# Patient Record
Sex: Female | Born: 1950 | Hispanic: No | Marital: Married | Smoking: Never smoker
Health system: Southern US, Community
[De-identification: ages and names within clinical notes are randomized; demographics above are authoritative.]

---

## 2020-05-07 ENCOUNTER — Encounter (HOSPITAL_BASED_OUTPATIENT_CLINIC_OR_DEPARTMENT_OTHER): Payer: Self-pay | Admitting: Emergency Medicine

## 2020-05-07 ENCOUNTER — Emergency Department (HOSPITAL_BASED_OUTPATIENT_CLINIC_OR_DEPARTMENT_OTHER): Payer: Self-pay

## 2020-05-07 ENCOUNTER — Other Ambulatory Visit: Payer: Self-pay

## 2020-05-07 ENCOUNTER — Emergency Department (HOSPITAL_BASED_OUTPATIENT_CLINIC_OR_DEPARTMENT_OTHER)
Admission: EM | Admit: 2020-05-07 | Discharge: 2020-05-07 | Disposition: A | Discharge status: Home / Self Care | Payer: Self-pay | Attending: Emergency Medicine | Admitting: Emergency Medicine

## 2020-05-07 DIAGNOSIS — R0602 Shortness of breath: Secondary | ICD-10-CM | POA: Insufficient documentation

## 2020-05-07 DIAGNOSIS — R202 Paresthesia of skin: Secondary | ICD-10-CM | POA: Insufficient documentation

## 2020-05-07 DIAGNOSIS — E669 Obesity, unspecified: Secondary | ICD-10-CM | POA: Insufficient documentation

## 2020-05-07 DIAGNOSIS — R200 Anesthesia of skin: Secondary | ICD-10-CM | POA: Insufficient documentation

## 2020-05-07 LAB — BASIC METABOLIC PANEL
Anion gap: 11 (ref 5–15)
BUN: 22 mg/dL (ref 8–23)
CO2: 23 mmol/L (ref 22–32)
Calcium: 9.3 mg/dL (ref 8.9–10.3)
Chloride: 106 mmol/L (ref 98–111)
Creatinine, Ser: 0.87 mg/dL (ref 0.44–1.00)
GFR calc Af Amer: >60 mL/min (ref >60)
GFR calc non Af Amer: >60 mL/min (ref >60)
Glucose, Bld: 82 mg/dL (ref 70–99)
Potassium: 4.3 mmol/L (ref 3.5–5.1)
Sodium: 140 mmol/L (ref 135–145)

## 2020-05-07 LAB — D-DIMER, QUANTITATIVE: D-Dimer, Quant: 1.09 ug/mL-FEU — ABNORMAL HIGH (ref 0.00–0.50)

## 2020-05-07 LAB — CBC WITH DIFFERENTIAL/PLATELET
Abs Immature Granulocytes: 0.04 10*3/uL (ref 0.00–0.07)
Basophils Absolute: 0.1 10*3/uL (ref 0.0–0.1)
Basophils Relative: 1 %
Eosinophils Absolute: 0.2 10*3/uL (ref 0.0–0.5)
Eosinophils Relative: 2 %
HCT: 36.8 % (ref 36.0–46.0)
Hemoglobin: 11.3 g/dL — ABNORMAL LOW (ref 12.0–15.0)
Immature Granulocytes: 0 %
Lymphocytes Relative: 28 %
Lymphs Abs: 3.6 10*3/uL (ref 0.7–4.0)
MCH: 28.1 pg (ref 26.0–34.0)
MCHC: 30.7 g/dL (ref 30.0–36.0)
MCV: 91.5 fL (ref 80.0–100.0)
Monocytes Absolute: 0.6 10*3/uL (ref 0.1–1.0)
Monocytes Relative: 4 %
Neutro Abs: 8.1 10*3/uL — ABNORMAL HIGH (ref 1.7–7.7)
Neutrophils Relative %: 65 %
Platelets: 354 10*3/uL (ref 150–400)
RBC: 4.02 MIL/uL (ref 3.87–5.11)
RDW: 14.2 % (ref 11.5–15.5)
WBC: 12.5 10*3/uL — ABNORMAL HIGH (ref 4.0–10.5)
nRBC: 0 % (ref 0.0–0.2)

## 2020-05-07 LAB — BRAIN NATRIURETIC PEPTIDE: B Natriuretic Peptide: 55.4 pg/mL (ref 0.0–100.0)

## 2020-05-07 LAB — TROPONIN I (HIGH SENSITIVITY)
Troponin I (High Sensitivity): 4 ng/L (ref <18)
Troponin I (High Sensitivity): 4 ng/L (ref <18)

## 2020-05-07 MED ORDER — IOHEXOL 350 MG/ML SOLN
100.0000 mL | Freq: Once | INTRAVENOUS | Status: AC | PRN
  Administered 2020-05-07: 100 mL via INTRAVENOUS

## 2020-05-07 NOTE — Discharge Instructions (Signed)
Please schedule a follow-up appointment with a primary care doctor as well as with cardiology regarding your shortness of breath.  Please return to ER if your breathing worsens, you develop worsening numbness, weakness, vision changes or speech changes.

## 2020-05-07 NOTE — ED Triage Notes (Signed)
Left leg numbness and SOB with exertion for months.  Is visiting family from [place]. Speaks Urdu.  Daughter in law in attendance to translate.

## 2020-05-07 NOTE — ED Notes (Signed)
Patient transported to X-ray 

## 2020-05-08 NOTE — ED Provider Notes (Signed)
[facility] EMERGENCY DEPARTMENT Provider Note   CSN: [id number] Arrival date & time: 05/07/20  1527     History Chief Complaint  Patient presents with  . L Leg Numb & SOB x Months    Ashley Andrade is a 69 y.o. female.  Presents to ER with concern for multiple symptoms.  Patient is from [place] is currently visiting family in [place].  Accompanied by daughter who is fluent in [place].  Ever since patient's husband passed a few years ago, she has had increased shortness of breath.  The shortness of breath is worse with exertion and seems to have worsened over the past few years.  The daughter is concerned that the shortness of breath has worsened since she has been visiting down in [place] over the past few weeks.  There has been no significant change in the breathing today.  There is no associated chest pain, no cough, fever.  The second concern is for patient's numb/tingling sensation in her legs.  Initially the daughter reported that patient had numbness in her left leg for many months.  When utilizing the language line translator however, the patient clarified that she has had tingling sensation in both of her legs, isolated to her feet, ankles and does not spread to the rest of her extremities.  There is no associated muscle weakness, she has had no speech changes, no vision changes or other neurologic concerns.  There is no change in the symptoms today.  Daughter reports that another family member who is a Advice worker had evaluated the patient and recommended that she come to the ER to get checked out.  Patient has never seen a medical provider for these concerns in this country.  Both patient and daughter are unaware of prior medical diagnoses from [place].  Does not take any prescriptions on a regular basis.  History obtained via daughter as well as directly via patient via audio language line interpreter.  HPI     History reviewed. No pertinent  past medical history.  There are no problems to display for this patient.   History reviewed. No pertinent surgical history.   OB History   No obstetric history on file.     No family history on file.  Social History   Tobacco Use  . Smoking status: Never Smoker  . Smokeless tobacco: Never Used  Substance Use Topics  . Alcohol use: Never  . Drug use: Not on file    Home Medications Prior to Admission medications   Not on File    Allergies    Patient has no known allergies.  Review of Systems   Review of Systems  Constitutional: Negative for chills and fever.  HENT: Negative for ear pain and sore throat.   Eyes: Negative for pain and visual disturbance.  Respiratory: Positive for shortness of breath. Negative for cough.   Cardiovascular: Negative for chest pain and palpitations.  Gastrointestinal: Negative for abdominal pain and vomiting.  Genitourinary: Negative for dysuria and hematuria.  Musculoskeletal: Negative for arthralgias and back pain.  Skin: Negative for color change and rash.  Neurological: Positive for numbness. Negative for seizures and syncope.  All other systems reviewed and are negative.   Physical Exam Updated Vital Signs BP (!) 151/66   Pulse 63   Temp 98 F (36.7 C)   Resp 16   Ht 5' (1.524 m)   Wt 90.7 kg   SpO2 98%   BMI 39.06 kg/m   Physical Exam  Vitals and nursing note reviewed.  Constitutional:      General: She is not in acute distress.    Appearance: She is well-developed. She is obese.  HENT:     Head: Normocephalic and atraumatic.  Eyes:     Conjunctiva/sclera: Conjunctivae normal.  Cardiovascular:     Rate and Rhythm: Normal rate and regular rhythm.     Heart sounds: No murmur heard.   Pulmonary:     Effort: Pulmonary effort is normal. No respiratory distress.     Breath sounds: Normal breath sounds.  Abdominal:     Palpations: Abdomen is soft.     Tenderness: There is no abdominal tenderness.    Musculoskeletal:     Cervical back: Neck supple.  Skin:    General: Skin is warm and dry.     Capillary Refill: Capillary refill takes less than 2 seconds.  Neurological:     Mental Status: She is alert.     Comments: AAOx3 CN 2-12 intact, speech clear visual fields intact 5/5 strength in b/l UE and LE Sensation to light touch intact in b/l UE and LE Normal FNF Normal gait     ED Results / Procedures / Treatments   Labs (all labs ordered are listed, but only abnormal results are displayed) Labs Reviewed  CBC WITH DIFFERENTIAL/PLATELET - Abnormal; Notable for the following components:      Result Value   WBC 12.5 (*)    Hemoglobin 11.3 (*)    Neutro Abs 8.1 (*)    All other components within normal limits  D-DIMER, QUANTITATIVE (NOT AT [facility]) - Abnormal; Notable for the following components:   D-Dimer, Quant 1.09 (*)    All other components within normal limits  BASIC METABOLIC PANEL  BRAIN NATRIURETIC PEPTIDE  TROPONIN I (HIGH SENSITIVITY)  TROPONIN I (HIGH SENSITIVITY)    EKG None  Radiology DG Chest 2 View  Result Date: 05/07/2020 CLINICAL DATA:  Left leg numbness and shortness of breath with exertion for months. EXAM: CHEST - 2 VIEW COMPARISON:  None. FINDINGS: Mildly enlarged cardiac silhouette with mild prominence of the pulmonary vasculature and interstitial markings. No pleural fluid. Unremarkable bones. IMPRESSION: 1. Mild cardiomegaly and mild pulmonary vascular congestion. 2. Mild chronic interstitial lung disease. Electronically Signed   By: Beckie Salts M.D.   On: 05/07/2020 20:24   CT Angio Chest PE W and/or Wo Contrast  Result Date: 05/07/2020 CLINICAL DATA:  69 year old female with shortness of breath and elevated D-dimer. Concern for pulmonary embolism. EXAM: CT ANGIOGRAPHY CHEST WITH CONTRAST TECHNIQUE: Multidetector CT imaging of the chest was performed using the standard protocol during bolus administration of intravenous contrast. Multiplanar CT image  reconstructions and MIPs were obtained to evaluate the vascular anatomy. CONTRAST:  OMNIPAQUE IOHEXOL 350 MG/ML SOLN COMPARISON:  Chest radiograph dated 05/07/2020. FINDINGS: Cardiovascular: Borderline cardiomegaly. No pericardial effusion. There is mild atherosclerotic calcification of the thoracic aorta. No aneurysmal dilatation or dissection. The origins of the great vessels of the aortic arch appear patent as visualized. No pulmonary artery embolus identified. Mediastinum/Nodes: There is no hilar or mediastinal adenopathy. The esophagus and the thyroid gland are grossly unremarkable. No mediastinal fluid collection. Lungs/Pleura: No focal consolidation, pleural effusion, or pneumothorax. The central airways are patent. Upper Abdomen: There is a 1 cm indeterminate left adrenal nodule. Probable mild fatty liver. Musculoskeletal: No chest wall abnormality. No acute or significant osseous findings. Review of the MIP images confirms the above findings. IMPRESSION: No acute intrathoracic pathology. No CT evidence  of pulmonary embolism. Electronically Signed   By: Elgie Collard M.D.   On: 05/07/2020 21:30    Procedures Procedures (including critical care time)  Medications Ordered in ED Medications  iohexol (OMNIPAQUE) 350 MG/ML injection 100 mL (100 mLs Intravenous Contrast Given 05/07/20 2114)    ED Course  I have reviewed the triage vital signs and the nursing notes.  Pertinent labs & imaging results that were available during my care of the patient were reviewed by me and considered in my medical decision making (see chart for details).    MDM Rules/Calculators/A&P                          69 year old lady presented to the emergency room with concern for shortness of breath, leg numbness.  Obtain detailed history both from patient's daughter as well as the patient directly through an interpreter.  Family is concerned about patient's chronic shortness of breath as numbness/tingling  sensation in her legs.  Regarding the shortness of breath, patient denies any acute worsening of this symptom and states that she is struggled with this chronically for years.  She has clear lungs, normal vital signs and no hypoxia.  Commenced broad work-up for this symptom which included checking EKG which had no acute changes, BNP which was within normal limits, troponin within normal limits, D-dimer was elevated so CTA chest was obtained which was negative for acute pulmonary embolism or other acute cardiopulmonary process.    Regarding the reported leg numbness.  Patient clarified that this was isolated to her feet, very lower legs.  She did have intact sensation to light touch when I examined her and had no other neurologic complaints, and I was unable to ascertain any neurologic deficits.  Given the chronicity of symptoms, lack of focal exam findings, I highly doubt any acute neurologic process is occurring today and at present do not feel patient warrants emergent head imaging or other emergent neurologic work-up.  Given today's reassuring exam and work-up, believe patient can be discharged from the ER and managed in the outpatient setting.  Obviously given patient's age, morbid obesity, she is at risk for multiple chronic medical conditions and would greatly benefit from getting established with a primary care doctor.  Given her chronic shortness of breath, believe she would also probably benefit from being evaluated by a cardiologist in the outpatient setting.  Provided information for the community care clinic as well as for cardiology.    After the discussed management above, the patient was determined to be safe for discharge.  The patient and daughter was in agreement with this plan and all questions regarding their care were answered.  ED return precautions were discussed and the patient will return to the ED with any significant worsening of condition.   Final Clinical Impression(s) / ED  Diagnoses Final diagnoses:  Shortness of breath  Numbness and tingling of leg    Rx / DC Orders ED Discharge Orders         Ordered    Ambulatory referral to Cardiology     Discontinue  Reprint     05/07/20 2222           Milagros Loll, MD 05/08/20 1314

## 2021-12-26 IMAGING — CT CT ANGIO CHEST
2 of 8 series · 19 of 36 positions shown · IV contrast (Omnipaque)
Comparison: Chest radiograph dated 05/07/2020.

CLINICAL DATA: 69-year-old female with shortness of breath and
elevated D-dimer. Concern for pulmonary embolism.

EXAM:
CT ANGIOGRAPHY CHEST WITH CONTRAST
TECHNIQUE: Multidetector CT imaging of the chest was performed using the
standard protocol during bolus administration of intravenous
contrast. Multiplanar CT image reconstructions and MIPs were
obtained to evaluate the vascular anatomy.
CONTRAST:  100mL OMNIPAQUE IOHEXOL 350 MG/ML SOLN

[Series 6: pe coronal mpr · coronal · 0.59mm/px · 1 of 94 slices shown]
[im 47/94  mediastinal]
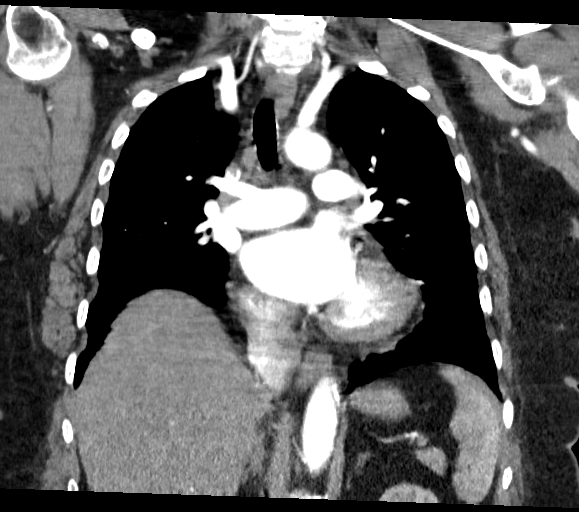

[Series 10: pe thins · axial · 0.68mm/px · z∈[-147,+103]mm · 18 of 371 slices shown]
[im 19/371  lung]
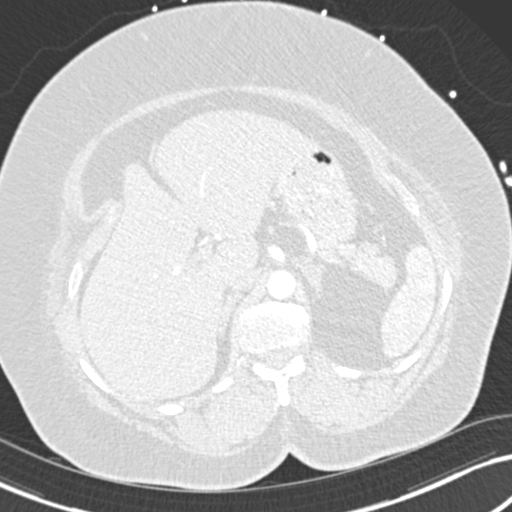
[im 38/371  mediastinal]
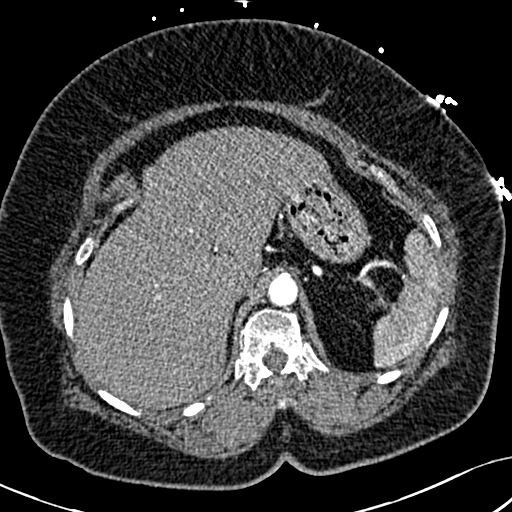
[im 56/371  lung]
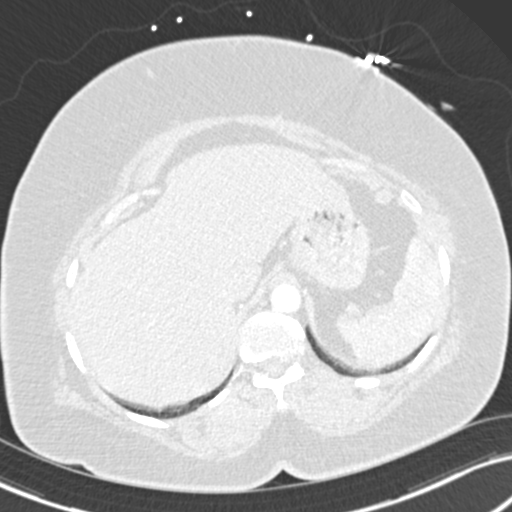
[im 75/371  mediastinal]
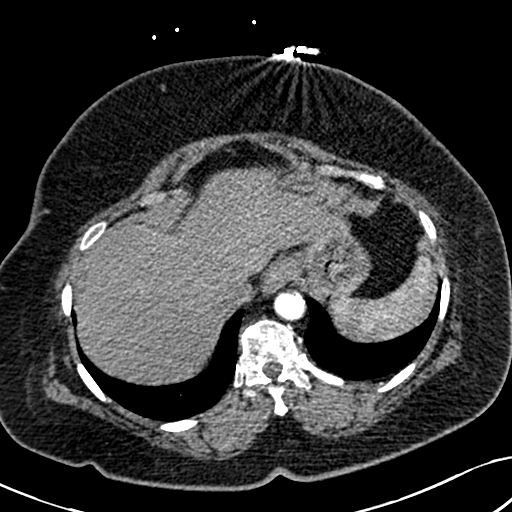
[im 93/371  lung]
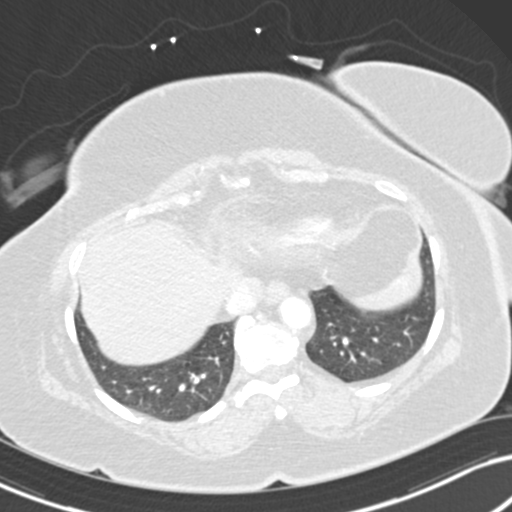
[im 112/371  mediastinal]
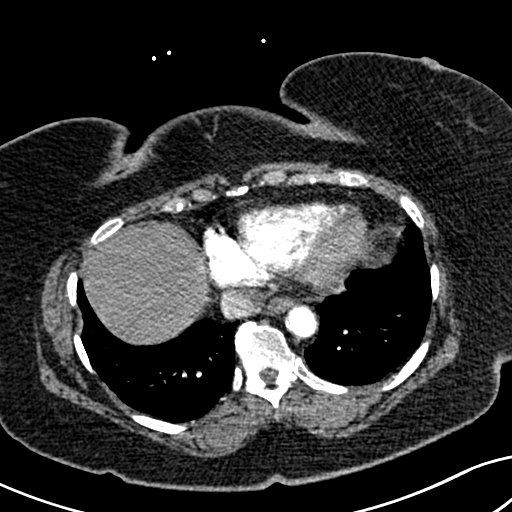
[im 130/371  lung]
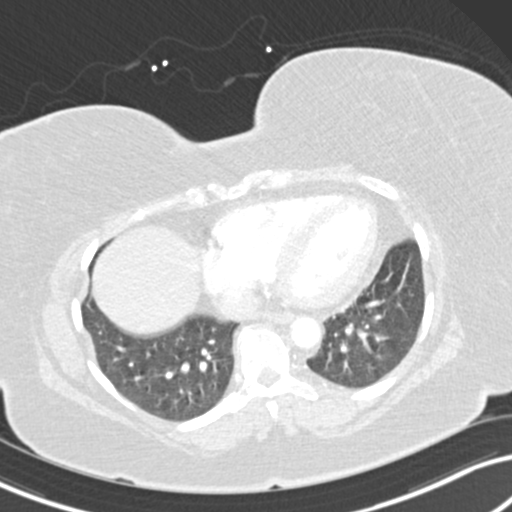
[im 149/371  mediastinal]
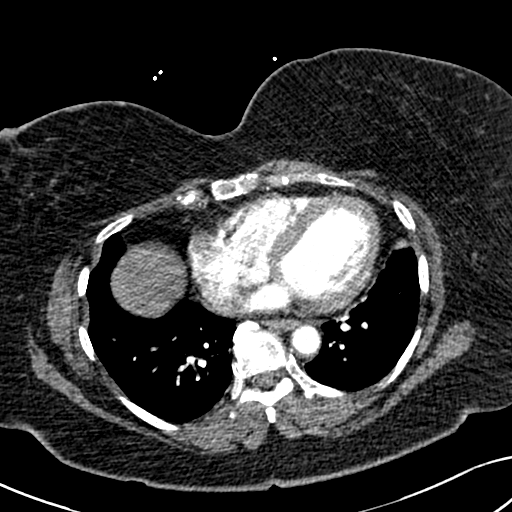
[im 167/371  lung]
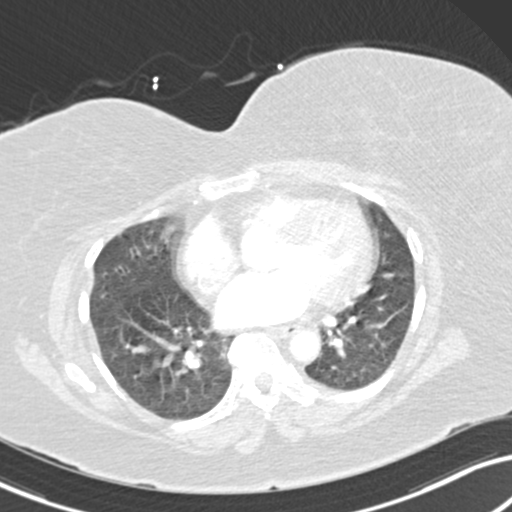
[im 204/371  mediastinal]
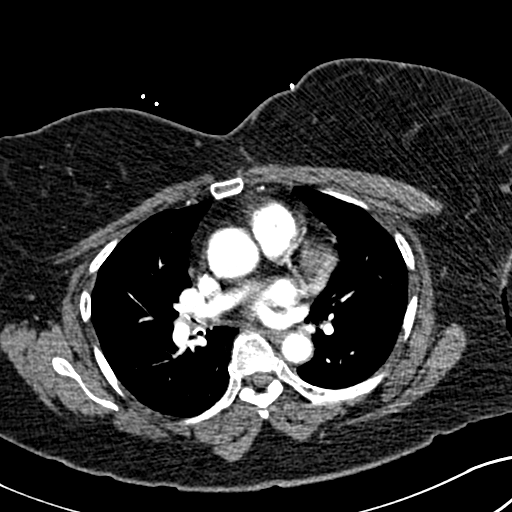
[im 223/371  lung]
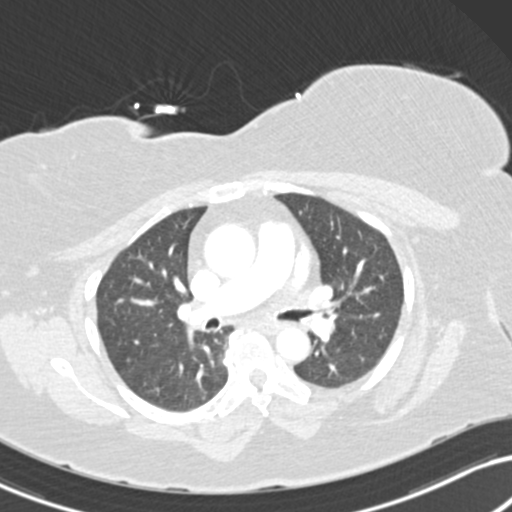
[im 241/371  mediastinal]
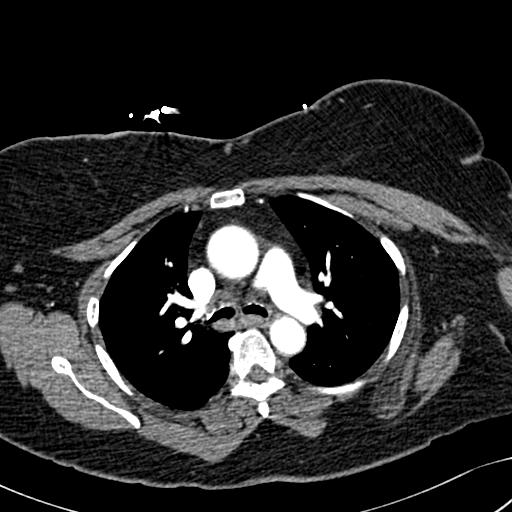
[im 260/371  lung]
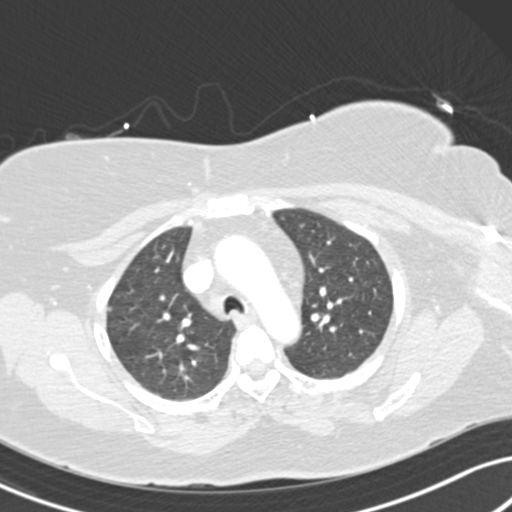
[im 278/371  mediastinal]
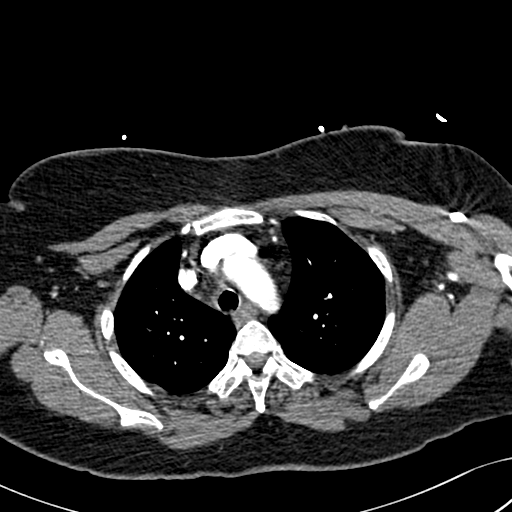
[im 297/371  lung]
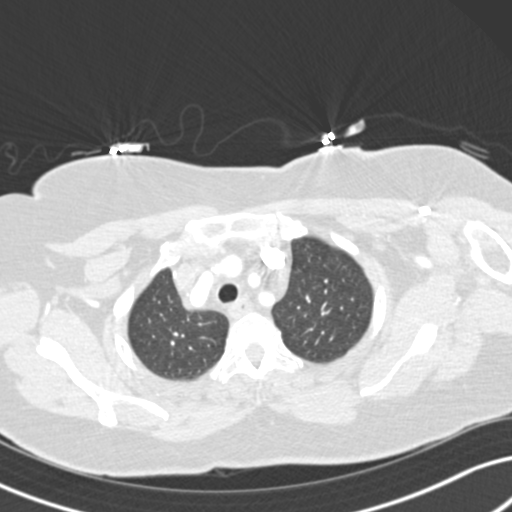
[im 315/371  mediastinal]
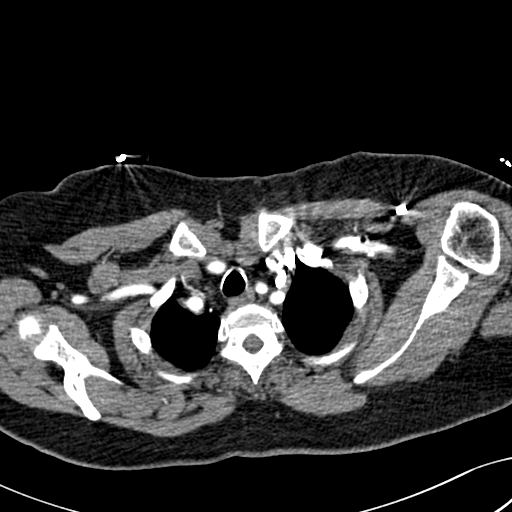
[im 334/371  lung]
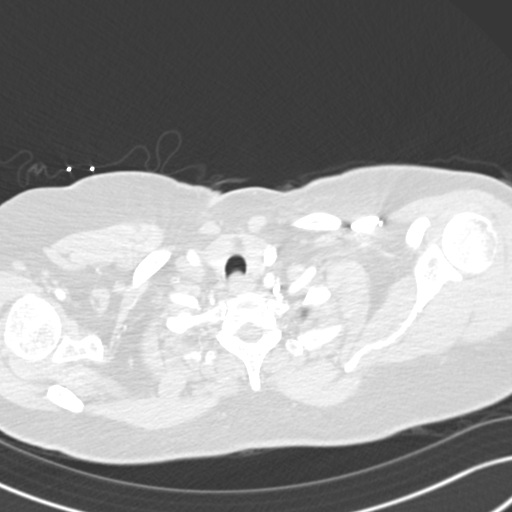
[im 352/371  mediastinal]
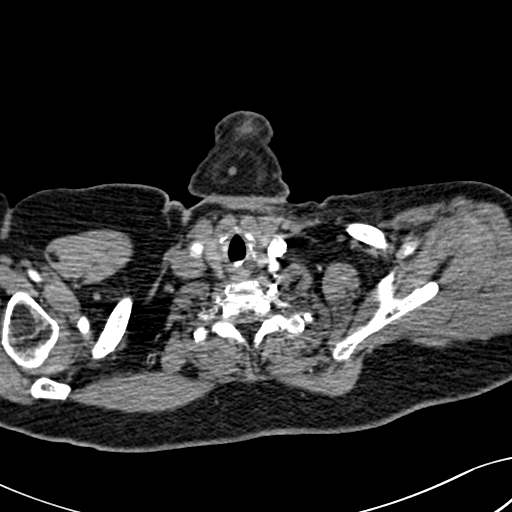

[19 of 36 positions shown; findings below may reference images not displayed]

FINDINGS: Cardiovascular: Borderline cardiomegaly. No pericardial effusion.
There is mild atherosclerotic calcification of the thoracic aorta.
No aneurysmal dilatation or dissection. The origins of the great
vessels of the aortic arch appear patent as visualized. No pulmonary
artery embolus identified.

Mediastinum/Nodes: There is no hilar or mediastinal adenopathy. The
esophagus and the thyroid gland are grossly unremarkable. No
mediastinal fluid collection.

Lungs/Pleura: No focal consolidation, pleural effusion, or
pneumothorax. The central airways are patent.

Upper Abdomen: There is a 1 cm indeterminate left adrenal nodule.
Probable mild fatty liver.

Musculoskeletal: No chest wall abnormality. No acute or significant
osseous findings.

Review of the MIP images confirms the above findings.
IMPRESSION: No acute intrathoracic pathology. No CT evidence of pulmonary
embolism.

## 2021-12-26 IMAGING — DX DG CHEST 2V
2 series · 2 of 2 positions shown · non-contrast
Comparison: None.

CLINICAL DATA: Left leg numbness and shortness of breath with
exertion for months.

EXAM:
CHEST - 2 VIEW

[chest pa]
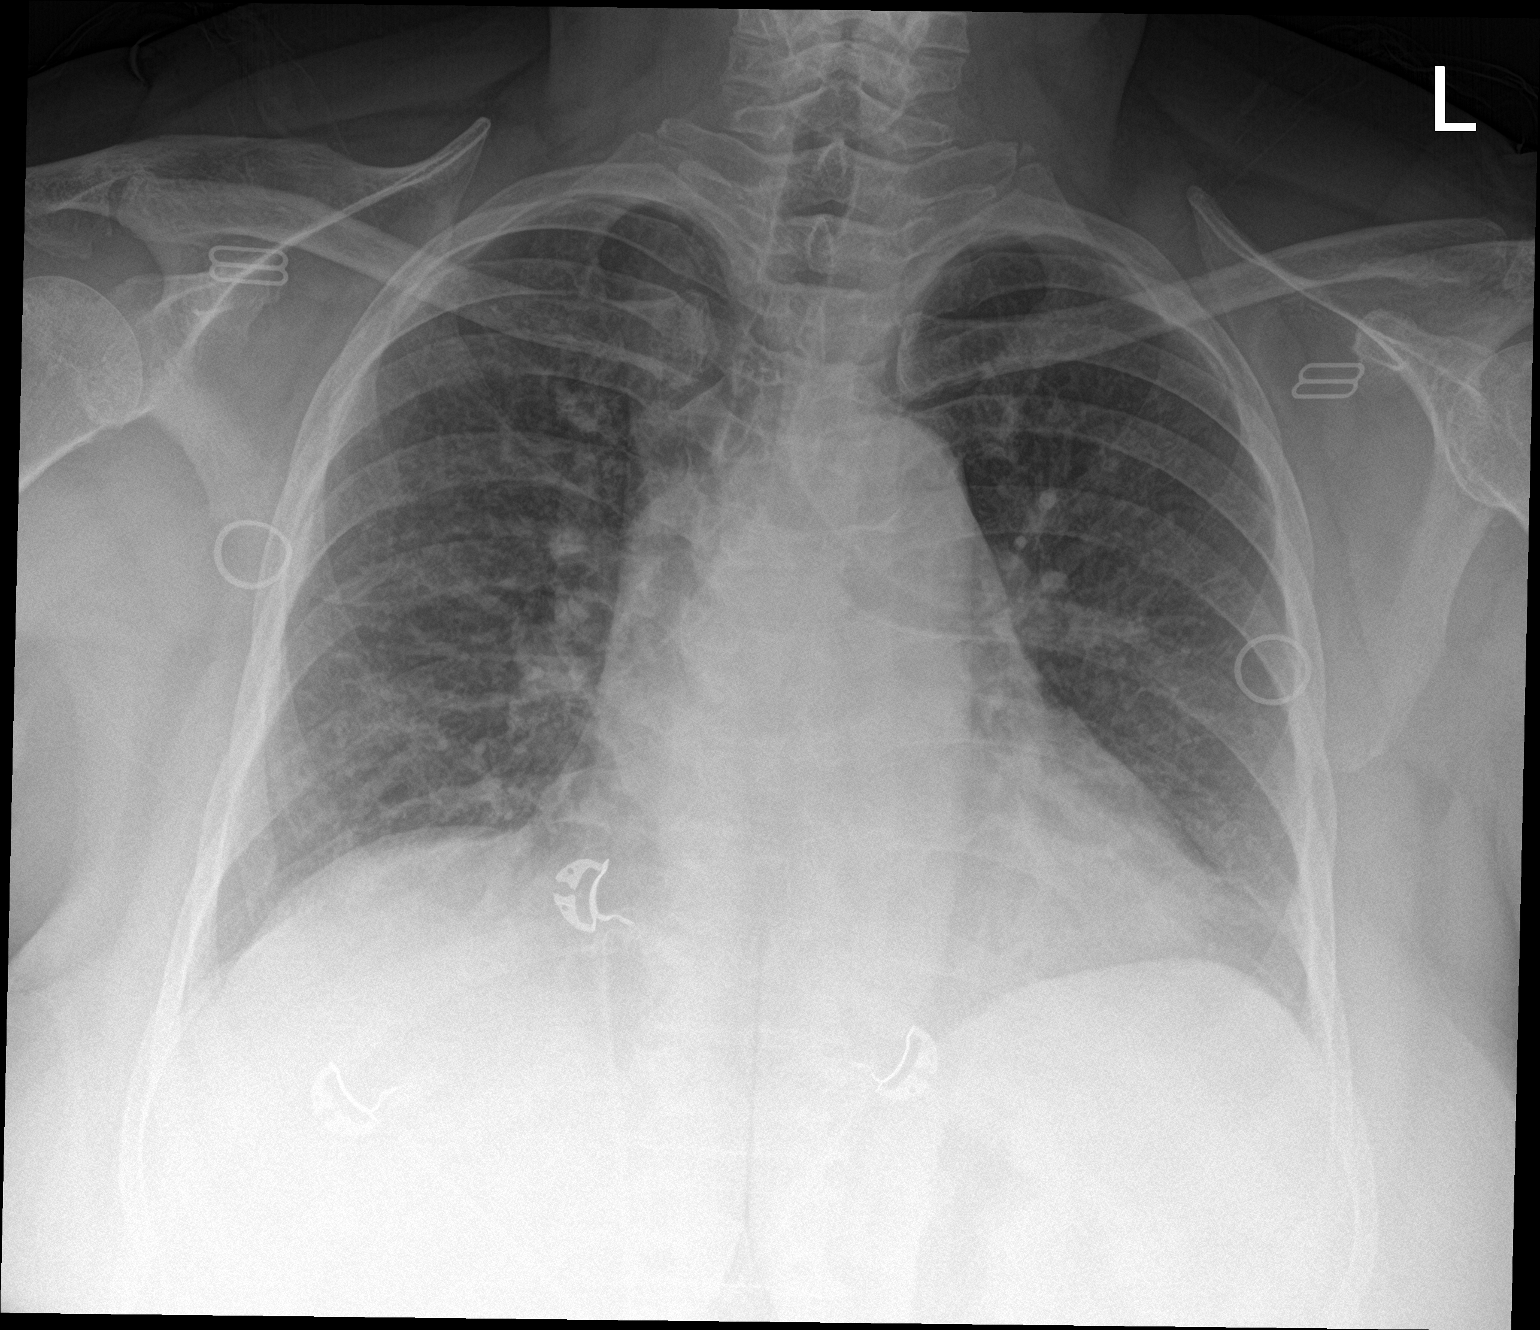

[chest lat]
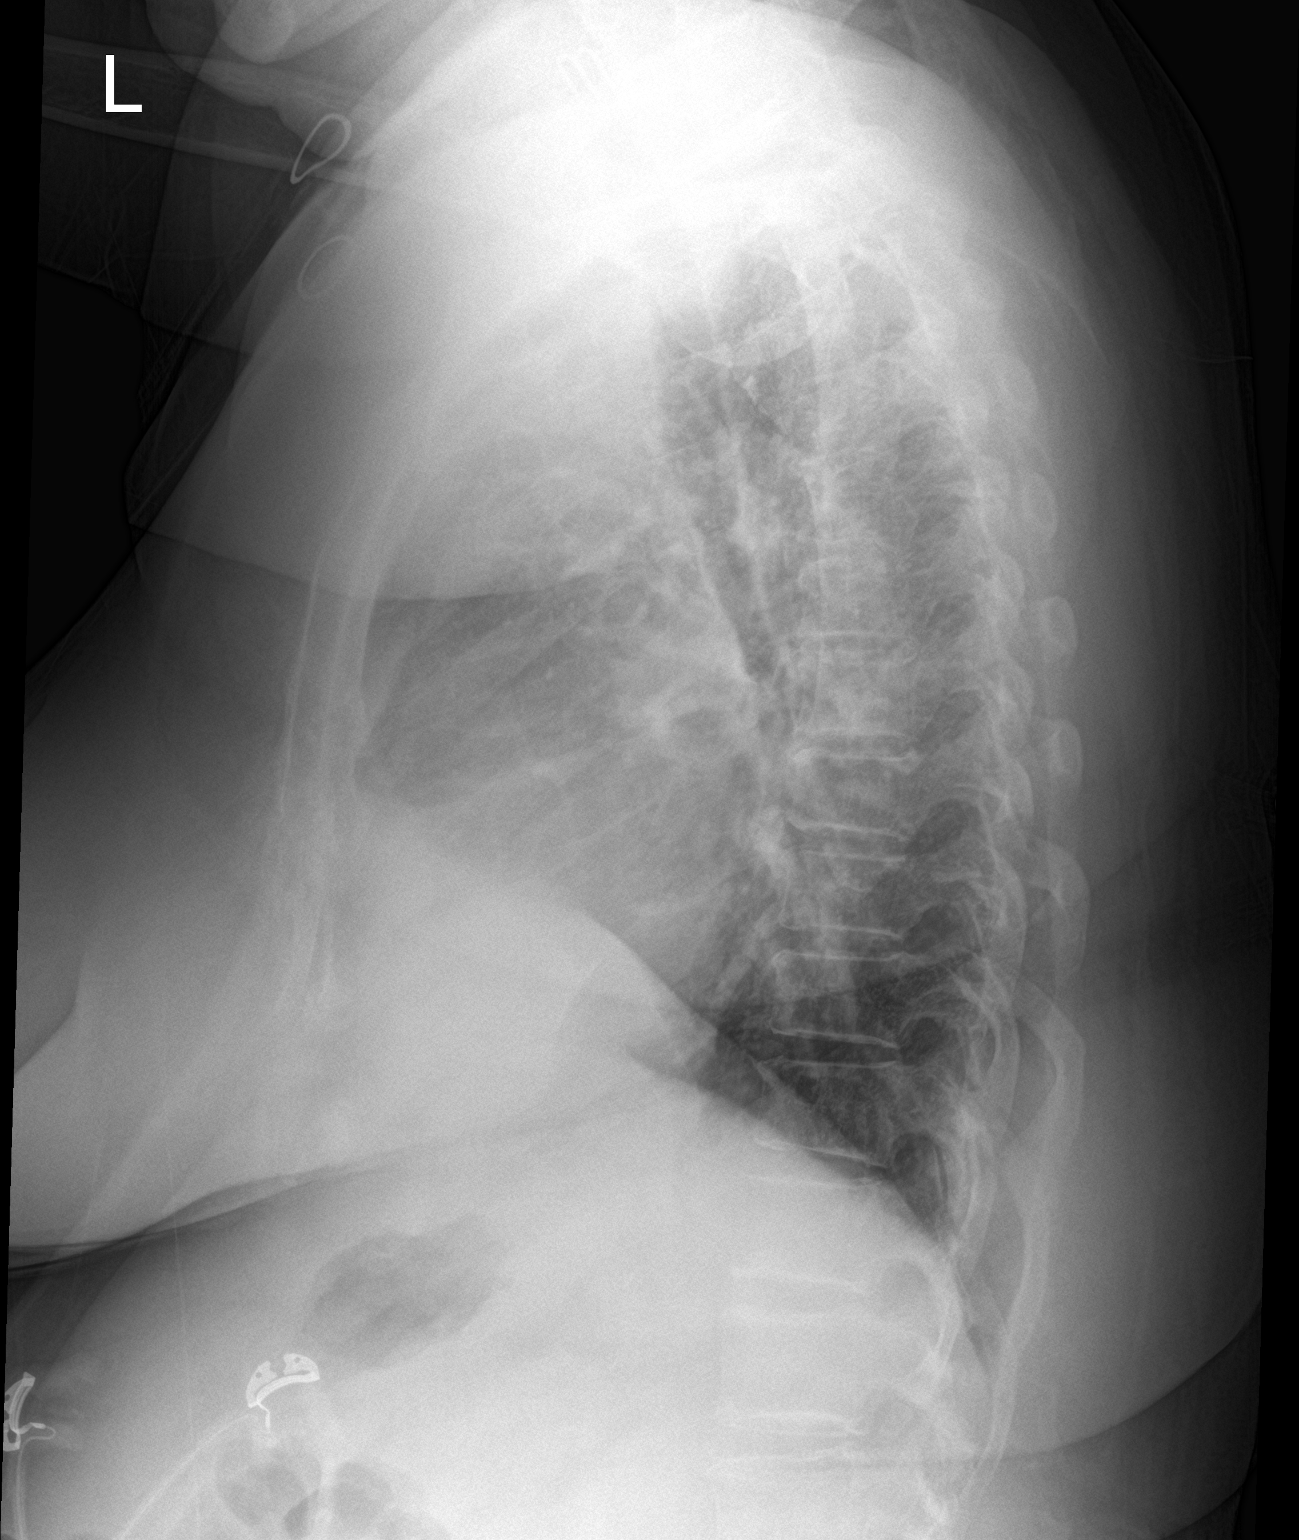

[2 of 2 positions shown; findings below may reference images not displayed]

FINDINGS: Mildly enlarged cardiac silhouette with mild prominence of the
pulmonary vasculature and interstitial markings. No pleural fluid.
Unremarkable bones.
IMPRESSION: 1. Mild cardiomegaly and mild pulmonary vascular congestion.
2. Mild chronic interstitial lung disease.
